# Patient Record
Sex: Male | Born: 1945 | Race: White | Hispanic: No | Marital: Married | Smoking: Former smoker
Health system: Southern US, Community
[De-identification: ages and names within clinical notes are randomized; demographics above are authoritative.]

## PROBLEM LIST (undated history)

## (undated) DIAGNOSIS — L4050 Arthropathic psoriasis, unspecified: Secondary | ICD-10-CM

## (undated) DIAGNOSIS — H409 Unspecified glaucoma: Secondary | ICD-10-CM

---

## 2005-09-10 ENCOUNTER — Ambulatory Visit: Payer: Self-pay | Admitting: Internal Medicine

## 2008-11-12 HISTORY — PX: HERNIA REPAIR: SHX51

## 2010-10-08 ENCOUNTER — Inpatient Hospital Stay (INDEPENDENT_AMBULATORY_CARE_PROVIDER_SITE_OTHER)
Admission: RE | Admit: 2010-10-08 | Discharge: 2010-10-08 | Disposition: A | Discharge status: Home / Self Care | Payer: Medicare PPO | Source: Ambulatory Visit | Attending: Emergency Medicine | Admitting: Emergency Medicine

## 2010-10-08 DIAGNOSIS — H6120 Impacted cerumen, unspecified ear: Secondary | ICD-10-CM

## 2011-07-15 ENCOUNTER — Telehealth (HOSPITAL_COMMUNITY): Payer: Self-pay | Admitting: *Deleted

## 2011-07-15 ENCOUNTER — Emergency Department (INDEPENDENT_AMBULATORY_CARE_PROVIDER_SITE_OTHER)
Admission: EM | Admit: 2011-07-15 | Discharge: 2011-07-15 | Disposition: A | Discharge status: Home / Self Care | Payer: Medicare PPO | Source: Home / Self Care | Attending: Emergency Medicine | Admitting: Emergency Medicine

## 2011-07-15 DIAGNOSIS — J069 Acute upper respiratory infection, unspecified: Secondary | ICD-10-CM

## 2011-07-15 HISTORY — DX: Arthropathic psoriasis, unspecified: L40.50

## 2011-07-15 MED ORDER — FLUTICASONE PROPIONATE 50 MCG/ACT NA SUSP: 2.0000 | Freq: Every day | NASAL | Status: AC

## 2011-07-15 MED ORDER — FEXOFENADINE-PSEUDOEPHED ER 60-120 MG PO TB12: 1.0000 | ORAL_TABLET | Freq: Two times a day (BID) | ORAL | Status: AC

## 2011-07-15 NOTE — ED Provider Notes (Signed)
History     CSN: [id number]  Arrival date & time 07/15/11  1043   First MD Initiated Contact with Patient 07/15/11 1324      Chief Complaint  Patient presents with  . Cough    3 day hx of cough, runny nose.  Today started having sore throat.  Has been taking mucinex, sudafed, and allegra.      (Consider location/radiation/quality/duration/timing/severity/associated sxs/prior treatment) HPI Comments: 4 days clear rhinorrhea, irritated throat, nonproductive cough worst in am. Axillary temp 100.3 last night.. No N/V, HA, wheeze, SOB, abd pain, urinary c/o diarrhea. Taking allegra, mucinex, sudafed PE w/o relief.   Patient is a 65 y.o. male presenting with cough.  Cough Associated symptoms include rhinorrhea and sore throat. Pertinent negatives include no chest pain, no ear pain, no headaches, no shortness of breath and no wheezing.    Past Medical History  Diagnosis Date  . Psoriatic arthritis   . Multiple myeloma     Past Surgical History  Procedure Date  . Hernia repair 11/2008    History reviewed. No pertinent family history.  History  Substance Use Topics  . Smoking status: Former Games developer  . Smokeless tobacco: Not on file  . Alcohol Use: No      Review of Systems  Constitutional: Positive for fever.  HENT: Positive for sore throat, rhinorrhea, sneezing and postnasal drip. Negative for ear pain, trouble swallowing and voice change.   Respiratory: Positive for cough. Negative for chest tightness, shortness of breath and wheezing.   Cardiovascular: Negative for chest pain.  Gastrointestinal: Negative for nausea, vomiting, abdominal pain and diarrhea.  Skin: Negative for rash.  Neurological: Negative for headaches.    Allergies  Review of patient's allergies indicates no known allergies.  Home Medications   Current Outpatient Rx  Name Route Sig Dispense Refill  . ALENDRONATE SODIUM 10 MG PO TABS Oral Take 10 mg by mouth daily before breakfast. Take with a  full glass of water on an empty stomach.     . FOLIC ACID 1 MG PO TABS Oral Take 1 mg by mouth daily.      Marland Kitchen METHOTREXATE (ANTI-RHEUMATIC) 2.5 MG PO TABS Oral Take 7.5 mg by mouth 3 (three) times a week.      . MULTI-VITAMIN/MINERALS PO TABS Oral Take 1 tablet by mouth daily.      Marland Kitchen RESVERATROL PO Oral Take 1 tablet by mouth.      Marland Kitchen FEXOFENADINE-PSEUDOEPHED ER 60-120 MG PO TB12 Oral Take 1 tablet by mouth every 12 (twelve) hours. 30 tablet 0  . FLUTICASONE PROPIONATE 50 MCG/ACT NA SUSP Nasal Place 2 sprays into the nose daily. 16 g 2    BP 147/77  Pulse 72  Temp(Src) 98.3 F (36.8 C) (Oral)  Resp 16  SpO2 98%  Physical Exam  Nursing note and vitals reviewed. Constitutional: He is oriented to person, place, and time. He appears well-developed and well-nourished.  HENT:  Head: Normocephalic and atraumatic.  Right Ear: Hearing, tympanic membrane and ear canal normal.  Left Ear: Hearing, tympanic membrane and ear canal normal.  Nose: Mucosal edema and rhinorrhea present. No epistaxis.  Mouth/Throat: Uvula is midline and mucous membranes are normal. Posterior oropharyngeal erythema present. No oropharyngeal exudate.       No sinus tenderness  Eyes: Conjunctivae and EOM are normal. Pupils are equal, round, and reactive to light.  Neck: Normal range of motion. Neck supple.  Cardiovascular: Normal rate, regular rhythm and normal heart sounds.   Pulmonary/Chest: Effort  normal and breath sounds normal. No respiratory distress.  Abdominal: Soft. Bowel sounds are normal. He exhibits no distension. There is no tenderness.  Musculoskeletal: Normal range of motion. He exhibits no edema and no tenderness.  Lymphadenopathy:    He has no cervical adenopathy.  Neurological: He is alert and oriented to person, place, and time.  Skin: Skin is warm and dry. No rash noted.  Psychiatric: He has a normal mood and affect. His behavior is normal. Judgment and thought content normal.    ED Course   Procedures (including critical care time)  Labs Reviewed - No data to display No results found.   1. URI (upper respiratory infection)       MDM    Luiz Blare, MD 07/15/11 [phone]

## 2011-07-15 NOTE — ED Notes (Signed)
3 day hx of cough, runny nose.  Today started having sore throat.  Has been taking mucinex, sudafed, and allegra.  Low grade fever last night.

## 2011-07-18 ENCOUNTER — Telehealth (HOSPITAL_COMMUNITY): Payer: Self-pay | Admitting: *Deleted

## 2016-11-06 ENCOUNTER — Emergency Department (HOSPITAL_COMMUNITY): Payer: Medicare HMO

## 2016-11-06 ENCOUNTER — Encounter (HOSPITAL_COMMUNITY): Payer: Self-pay

## 2016-11-06 ENCOUNTER — Emergency Department (HOSPITAL_COMMUNITY)
Admission: EM | Admit: 2016-11-06 | Discharge: 2016-11-06 | Disposition: A | Discharge status: Home / Self Care | Payer: Medicare HMO | Attending: Emergency Medicine | Admitting: Emergency Medicine

## 2016-11-06 DIAGNOSIS — R42 Dizziness and giddiness: Secondary | ICD-10-CM | POA: Diagnosis not present

## 2016-11-06 DIAGNOSIS — Z7982 Long term (current) use of aspirin: Secondary | ICD-10-CM | POA: Insufficient documentation

## 2016-11-06 DIAGNOSIS — Z87891 Personal history of nicotine dependence: Secondary | ICD-10-CM | POA: Insufficient documentation

## 2016-11-06 DIAGNOSIS — R509 Fever, unspecified: Secondary | ICD-10-CM | POA: Diagnosis not present

## 2016-11-06 DIAGNOSIS — R55 Syncope and collapse: Secondary | ICD-10-CM | POA: Insufficient documentation

## 2016-11-06 LAB — CBC
HCT: 36.2 % — ABNORMAL LOW (ref 39.0–52.0)
Hemoglobin: 12.2 g/dL — ABNORMAL LOW (ref 13.0–17.0)
MCH: 32.7 pg (ref 26.0–34.0)
MCHC: 33.7 g/dL (ref 30.0–36.0)
MCV: 97.1 fL (ref 78.0–100.0)
PLATELETS: 147 10*3/uL — AB (ref 150–400)
RBC: 3.73 MIL/uL — AB (ref 4.22–5.81)
RDW: 14.3 % (ref 11.5–15.5)
WBC: 10.7 10*3/uL — AB (ref 4.0–10.5)

## 2016-11-06 LAB — BASIC METABOLIC PANEL
Anion gap: 8 (ref 5–15)
BUN: 18 mg/dL (ref 6–20)
CALCIUM: 8.3 mg/dL — AB (ref 8.9–10.3)
CHLORIDE: 108 mmol/L (ref 101–111)
CO2: 22 mmol/L (ref 22–32)
CREATININE: 1.11 mg/dL (ref 0.61–1.24)
GFR calc Af Amer: >60 mL/min (ref >60)
GFR calc non Af Amer: >60 mL/min (ref >60)
GLUCOSE: 112 mg/dL — AB (ref 65–99)
Potassium: 3.6 mmol/L (ref 3.5–5.1)
Sodium: 138 mmol/L (ref 135–145)

## 2016-11-06 LAB — URINALYSIS, ROUTINE W REFLEX MICROSCOPIC
Bilirubin Urine: NEGATIVE
GLUCOSE, UA: NEGATIVE mg/dL
Hgb urine dipstick: NEGATIVE
Ketones, ur: NEGATIVE mg/dL
LEUKOCYTES UA: NEGATIVE
Nitrite: NEGATIVE
PROTEIN: NEGATIVE mg/dL
Specific Gravity, Urine: 1.018 (ref 1.005–1.030)
pH: 5 (ref 5.0–8.0)

## 2016-11-06 LAB — CBG MONITORING, ED: Glucose-Capillary: 115 mg/dL — ABNORMAL HIGH (ref 65–99)

## 2016-11-06 MED ORDER — SODIUM CHLORIDE 0.9 % IV BOLUS (SEPSIS)
1000.0000 mL | Freq: Once | INTRAVENOUS | Status: AC
Start: 2016-11-06 — End: 2016-11-06
  Administered 2016-11-06: 1000 mL via INTRAVENOUS

## 2016-11-06 MED ORDER — SODIUM CHLORIDE 0.9 % IV BOLUS (SEPSIS)
1000.0000 mL | Freq: Once | INTRAVENOUS | Status: AC
  Administered 2016-11-06: 1000 mL via INTRAVENOUS

## 2016-11-06 NOTE — ED Notes (Addendum)
Pt ambulated 50 ft.  Pt showed no signs of distress.  Pt maintained a steady gait.  Pt stated that they felt well .  All vital signs within normal limits.

## 2016-11-06 NOTE — ED Notes (Signed)
ED Provider at bedside. 

## 2016-11-06 NOTE — ED Notes (Signed)
Per EMS- Pt woke up to use bathroom, became dizzy, almost passed out. Incontinent of stool during incident. Recorded temp of 102.  Pt has had intermittent cough for about a month. Pt took 975 of tylenol PTA.

## 2016-11-06 NOTE — ED Notes (Signed)
Bed: WU98 Expected date:  Expected time:  Means of arrival:  Comments: Syncopal episode/incontinent

## 2016-11-06 NOTE — ED Provider Notes (Signed)
[facility] DEPT Provider Note   CSN: [id number] Arrival date & time: 11/06/16  0325     History   Chief Complaint Chief Complaint  Patient presents with  . Near Syncope    HPI Jonathan Neal is a 71 y.o. male.  The history is provided by the patient and the spouse.  Near Syncope  This is a new problem. Episode onset: just prior to arrival. The problem has been gradually improving. Pertinent negatives include no chest pain, no abdominal pain and no shortness of breath. The symptoms are aggravated by walking. The symptoms are relieved by rest.  patient presents with near syncope He reports he woke up to go to bathroom and felt dizzy/lightheaded and nearly passed out He was incontinent of urine during this time ( denied stool incontinence) No seizures No cp He has had recent cough/congestion Wife reports fever up to 102 at home and then took APAP  He has no pain complaints at this time   He denies h/o CAD/CVA No new medications Past Medical History:  Diagnosis Date  . Multiple myeloma   . Psoriatic arthritis ([place])     There are no active problems to display for this patient.   Past Surgical History:  Procedure Laterality Date  . HERNIA REPAIR  11/2008       Home Medications    Prior to Admission medications   Medication Sig Start Date End Date Taking? Authorizing Provider  aspirin EC 81 MG tablet Take 81 mg by mouth daily.   Yes Historical Provider, MD  calcium carbonate (OS-CAL - DOSED IN MG OF ELEMENTAL CALCIUM) 1250 (500 Ca) MG tablet Take 1 tablet by mouth.   Yes Historical Provider, MD  fluticasone (FLONASE) 50 MCG/ACT nasal spray Place 2 sprays into the nose daily. 07/15/11 11/06/16 Yes Melynda Ripple, MD  folic acid (FOLVITE) 1 MG tablet Take 1 mg by mouth daily.     Yes Historical Provider, MD  methotrexate (RHEUMATREX) 2.5 MG tablet Take 15 mg by mouth every 7 (seven) days.    Yes Historical Provider, MD  Multiple Vitamins-Minerals  (MULTIVITAMIN WITH MINERALS) tablet Take 1 tablet by mouth daily.     Yes Historical Provider, MD  Omega-3 Fatty Acids (FISH OIL PO) Take 1 capsule by mouth daily.   Yes Historical Provider, MD    Family History No family history on file.  Social History Social History  Substance Use Topics  . Smoking status: Former Research scientist (life sciences)  . Smokeless tobacco: Not on file  . Alcohol use No     Allergies   Patient has no known allergies.   Review of Systems Review of Systems  Constitutional: Positive for diaphoresis and fever.  Respiratory: Negative for shortness of breath.   Cardiovascular: Positive for near-syncope. Negative for chest pain.  Gastrointestinal: Negative for abdominal pain.  Neurological: Positive for light-headedness. Negative for seizures.  All other systems reviewed and are negative.    Physical Exam Updated Vital Signs BP 118/64 (BP Location: Left Arm)   Pulse 83   Temp 97.8 F (36.6 C) (Oral)   Resp 15   SpO2 94%   Physical Exam CONSTITUTIONAL: Well developed/well nourished HEAD: Normocephalic/atraumatic EYES: EOMI/PERRL ENMT: Mucous membranes moist NECK: supple no meningeal signs SPINE/BACK:entire spine nontender CV: S1/S2 noted, no murmurs/rubs/gallops noted LUNGS: crackles in left base, no distress noted ABDOMEN: soft, nontender, no rebound or guarding, bowel sounds noted throughout abdomen GU:no cva tenderness NEURO: Pt is awake/alert/appropriate, moves all extremitiesx4.  No facial droop.  EXTREMITIES: pulses normal/equal, full ROM SKIN: warm, color normal PSYCH: no abnormalities of mood noted, alert and oriented to situation   ED Treatments / Results  Labs (all labs ordered are listed, but only abnormal results are displayed) Labs Reviewed  BASIC METABOLIC PANEL - Abnormal; Notable for the following:       Result Value   Glucose, Bld 112 (*)    Calcium 8.3 (*)    All other components within normal limits  CBC - Abnormal; Notable for the  following:    WBC 10.7 (*)    RBC 3.73 (*)    Hemoglobin 12.2 (*)    HCT 36.2 (*)    Platelets 147 (*)    All other components within normal limits  CBG MONITORING, ED - Abnormal; Notable for the following:    Glucose-Capillary 115 (*)    All other components within normal limits  URINALYSIS, ROUTINE W REFLEX MICROSCOPIC    EKG  EKG Interpretation  Date/Time:  Wednesday November 06 2016 03:35:53 EDT Ventricular Rate:  85 PR Interval:    QRS Duration: 93 QT Interval:  348 QTC Calculation: 414 R Axis:   47 Text Interpretation:  Sinus rhythm No previous ECGs available Confirmed by Christy Gentles  MD, Leniya Breit ([id number]) on 11/06/2016 3:55:59 AM       Radiology Dg Chest 2 View  Result Date: 11/06/2016 CLINICAL DATA:  Syncope and incontinence EXAM: CHEST  2 VIEW COMPARISON:  None. FINDINGS: There is mild cardiomegaly. There is bibasilar atelectasis but no focal consolidation. No pulmonary edema. No pleural effusion or pneumothorax. IMPRESSION: No active cardiopulmonary disease. Electronically Signed   By: Ulyses Jarred M.D.   On: 11/06/2016 05:52    Procedures Procedures (including critical care time)  Medications Ordered in ED Medications  sodium chloride 0.9 % bolus 1,000 mL (0 mLs Intravenous Stopped 11/06/16 0656)  sodium chloride 0.9 % bolus 1,000 mL (1,000 mLs Intravenous New Bag/Given 11/06/16 0655)     Initial Impression / Assessment and Plan / ED Course  I have reviewed the triage vital signs and the nursing notes.  Pertinent labs & imaging results that were available during my care of the patient were reviewed by me and considered in my medical decision making (see chart for details).     Pt presents with near syncope episode after standing abruptly from sleeping He did not have full LOC He denied CP/SOB He is now at baseline No focal weakness reported He responded to IV fluids He ambulated without difficulty No hypoxia on RA I offered admission/monitoring, but patient  declined He would like to go home We discussed strict ER return precautions   Final Clinical Impressions(s) / ED Diagnoses   Final diagnoses:  Near syncope    New Prescriptions New Prescriptions   No medications on file     Ripley Fraise, MD 11/06/16 [phone]

## 2017-04-28 ENCOUNTER — Emergency Department (HOSPITAL_COMMUNITY): Payer: Non-veteran care

## 2017-04-28 ENCOUNTER — Encounter (HOSPITAL_COMMUNITY): Payer: Self-pay | Admitting: Emergency Medicine

## 2017-04-28 ENCOUNTER — Emergency Department (HOSPITAL_COMMUNITY)
Admission: EM | Admit: 2017-04-28 | Discharge: 2017-04-28 | Disposition: A | Discharge status: Home / Self Care | Payer: Non-veteran care | Attending: Emergency Medicine | Admitting: Emergency Medicine

## 2017-04-28 DIAGNOSIS — W230XXA Caught, crushed, jammed, or pinched between moving objects, initial encounter: Secondary | ICD-10-CM | POA: Diagnosis not present

## 2017-04-28 DIAGNOSIS — Y999 Unspecified external cause status: Secondary | ICD-10-CM | POA: Insufficient documentation

## 2017-04-28 DIAGNOSIS — Y929 Unspecified place or not applicable: Secondary | ICD-10-CM | POA: Diagnosis not present

## 2017-04-28 DIAGNOSIS — Z79899 Other long term (current) drug therapy: Secondary | ICD-10-CM | POA: Diagnosis not present

## 2017-04-28 DIAGNOSIS — M25512 Pain in left shoulder: Secondary | ICD-10-CM | POA: Diagnosis not present

## 2017-04-28 DIAGNOSIS — Y939 Activity, unspecified: Secondary | ICD-10-CM | POA: Diagnosis not present

## 2017-04-28 DIAGNOSIS — S99911A Unspecified injury of right ankle, initial encounter: Secondary | ICD-10-CM | POA: Diagnosis not present

## 2017-04-28 DIAGNOSIS — S82831A Other fracture of upper and lower end of right fibula, initial encounter for closed fracture: Secondary | ICD-10-CM | POA: Insufficient documentation

## 2017-04-28 DIAGNOSIS — Z87891 Personal history of nicotine dependence: Secondary | ICD-10-CM | POA: Insufficient documentation

## 2017-04-28 HISTORY — DX: Unspecified glaucoma: H40.9

## 2017-04-28 MED ORDER — IBUPROFEN 600 MG PO TABS: 600.0000 mg | ORAL_TABLET | Freq: Four times a day (QID) | ORAL | 0 refills | Status: AC | PRN

## 2017-04-28 MED ORDER — BACITRACIN ZINC 500 UNIT/GM EX OINT
TOPICAL_OINTMENT | CUTANEOUS | Status: AC
  Administered 2017-04-28: 18:00:00
  Filled 2017-04-28: qty 0.9

## 2017-04-28 MED ORDER — ACETAMINOPHEN 325 MG PO TABS
650.0000 mg | ORAL_TABLET | Freq: Once | ORAL | Status: AC
  Administered 2017-04-28: 650 mg via ORAL
  Filled 2017-04-28: qty 2

## 2017-04-28 MED ORDER — IBUPROFEN 200 MG PO TABS
400.0000 mg | ORAL_TABLET | Freq: Once | ORAL | Status: AC
  Administered 2017-04-28: 400 mg via ORAL
  Filled 2017-04-28: qty 2

## 2017-04-28 NOTE — Discharge Instructions (Signed)
Please read and follow all provided instructions.  Your diagnoses today include:  1. Other closed fracture of distal end of right fibula, initial encounter   2. Acute pain of left shoulder     Tests performed today include: Vital signs. See below for your results today.   Medications prescribed:  Take as prescribed   Home care instructions:  Follow any educational materials contained in this packet.  Follow-up instructions: Please follow-up with your Orthopedics for further evaluation of symptoms and treatment   Return instructions:  Please return to the Emergency Department if you do not get better, if you get worse, or new symptoms OR  - Fever (temperature greater than 101.17F)  - Bleeding that does not stop with holding pressure to the area    -Severe pain (please note that you may be more sore the day after your accident)  - Chest Pain  - Difficulty breathing  - Severe nausea or vomiting  - Inability to tolerate food and liquids  - Passing out  - Skin becoming red around your wounds  - Change in mental status (confusion or lethargy)  - New numbness or weakness    Please return if you have any other emergent concerns.  Additional Information:  Your vital signs today were: BP (!) 153/100 (BP Location: Right Arm)    Pulse 65    Temp 97.8 F (36.6 C) (Oral)    Resp 18    SpO2 99%  If your blood pressure (BP) was elevated above 135/85 this visit, please have this repeated by your doctor within one month. ---------------

## 2017-04-28 NOTE — ED Triage Notes (Signed)
Pt verbalizes "printing machine" falling on him resulting in left shoulder and right ankle pain. Abrasion noted to right ear and right hand. Pt FOM of right hand. Pt denies LOC.

## 2017-04-28 NOTE — ED Provider Notes (Signed)
[facility] DEPT Provider Note   CSN: [id number] Arrival date & time: 04/28/17  1231     History   Chief Complaint Chief Complaint  Patient presents with  . Ankle Injury  . Shoulder Injury    HPI Jonathan Neal is a 71 y.o. male.  HPI  71 y.o. male with a hx of Multiple Myeloma, presents to the Emergency Department today due to left shoulder and right ankle pain. Pt states a printing press slipped form his grasp from a fork lift and "torqued" his left shoulder, but landed on his right ankle. Notes pain with ROM. Rates 6/10. Throbbing. Mild abrasion on lateral aspect. Tetanus UTD. Denies head trauma or LOC. No numbness/tingling. No fevers. No meds PTA. No other symptoms noted.   Past Medical History:  Diagnosis Date  . Glaucoma   . Multiple myeloma   . Psoriatic arthritis ([place])     There are no active problems to display for this patient.   Past Surgical History:  Procedure Laterality Date  . HERNIA REPAIR  11/2008       Home Medications    Prior to Admission medications   Medication Sig Start Date End Date Taking? Authorizing Provider  aspirin EC 81 MG tablet Take 81 mg by mouth daily.    [provider]  calcium carbonate (OS-CAL - DOSED IN MG OF ELEMENTAL CALCIUM) 1250 (500 Ca) MG tablet Take 1 tablet by mouth.    [provider]  fluticasone (FLONASE) 50 MCG/ACT nasal spray Place 2 sprays into the nose daily. 07/15/11 11/06/16  Melynda Ripple, MD  folic acid (FOLVITE) 1 MG tablet Take 1 mg by mouth daily.      [provider]  methotrexate (RHEUMATREX) 2.5 MG tablet Take 15 mg by mouth every 7 (seven) days.     [provider]  Multiple Vitamins-Minerals (MULTIVITAMIN WITH MINERALS) tablet Take 1 tablet by mouth daily.      [provider]  Omega-3 Fatty Acids (FISH OIL PO) Take 1 capsule by mouth daily.    [provider]    Family History No family history on  file.  Social History Social History  Substance Use Topics  . Smoking status: Former Research scientist (life sciences)  . Smokeless tobacco: Not on file  . Alcohol use No     Allergies   Patient has no known allergies.   Review of Systems Review of Systems ROS reviewed and all are negative for acute change except as noted in the HPI.  Physical Exam Updated Vital Signs BP (!) 137/93 (BP Location: Right Arm)   Pulse (!) 52   Temp 97.6 F (36.4 C) (Oral)   Resp 16   SpO2 96%   Physical Exam  Constitutional: He is oriented to person, place, and time. Vital signs are normal. He appears well-developed and well-nourished.  HENT:  Head: Normocephalic and atraumatic.  Right Ear: Hearing normal.  Left Ear: Hearing normal.  Eyes: Pupils are equal, round, and reactive to light. Conjunctivae and EOM are normal.  Neck: Normal range of motion. Neck supple.  Cardiovascular: Normal rate, regular rhythm, normal heart sounds and intact distal pulses.   Pulmonary/Chest: Effort normal and breath sounds normal.  Musculoskeletal: Normal range of motion.  Right ankle with swelling noted. Compartments soft. NVI. Distal pulses appreciated. TTP distal fib and medial mal.  Right shoulder with mild TTP distal clavicle. No swelling. ROM intact, but noted difficulty with abduction.   Neurological: He is alert and oriented to  person, place, and time.  Skin: Skin is warm and dry.  Psychiatric: He has a normal mood and affect. His speech is normal and behavior is normal. Thought content normal.  Nursing note and vitals reviewed.    ED Treatments / Results  Labs (all labs ordered are listed, but only abnormal results are displayed) Labs Reviewed - No data to display  EKG  EKG Interpretation None       Radiology Dg Ankle Complete Right  Result Date: 04/28/2017 CLINICAL DATA:  Right ankle injury.  Initial encounter. EXAM: RIGHT ANKLE - COMPLETE 3+ VIEW COMPARISON:  None. FINDINGS: Oblique fracture of the distal  fibula at and above the ankle joint, nondisplaced. Widened but nondisplaced transverse fracture of the medial malleolus at the level of the ankle joint. No posterior malleolus or hindfoot fracture is noted. No dislocation. IMPRESSION: Distal fibular and medial malleolus fractures as described. Electronically Signed   By: Monte Fantasia M.D.   On: 04/28/2017 13:47   Dg Shoulder Left  Result Date: 04/28/2017 CLINICAL DATA:  Left shoulder pain after printing machine fell on him. EXAM: LEFT SHOULDER - 2+ VIEW COMPARISON:  Chest x-ray dated November 06, 2016. FINDINGS: No definite acute fracture or dislocation. 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 The acromioclavicular and glenohumeral joint spaces are preserved. Soft tissues are unremarkable. IMPRESSION: No definite acute fracture. 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 Correlate with point tenderness. Electronically Signed   By: Titus Dubin M.D.   On: 04/28/2017 13:50    Procedures Procedures (including critical care time)  Medications Ordered in ED Medications - No data to display   Initial Impression / Assessment and Plan / ED Course  I have reviewed the triage vital signs and the nursing notes.  Pertinent labs & imaging results that were available during my care of the patient were reviewed by me and considered in my medical decision making (see chart for details).  Final Clinical Impressions(s) / ED Diagnoses   {I have reviewed and evaluated the relevant imaging studies.  {I have reviewed the relevant previous healthcare records.  {I obtained HPI from historian.   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 Right ankle with distal fibular and medial malleolus fractures. Placed in posterior with stirrup splint. Crutches. Pt advised to follow up with orthopedics. Conservative therapy recommended  and discussed. I have reviewed the [place] Controlled Substance Reporting System. . Given Rx Percocet. Patient will be discharged home & is agreeable with above plan. Returns precautions discussed. Pt appears safe for discharge.  Disposition/Plan:  DC Home Additional Verbal discharge instructions given and discussed with patient.  Pt Instructed to f/u with Ortho in the next week for evaluation and treatment of symptoms. Return precautions given Pt acknowledges and agrees with plan  Supervising Physician Milton Ferguson, MD  Final diagnoses:  Other closed fracture of distal end of right fibula, initial encounter  Acute pain of left shoulder    New Prescriptions New Prescriptions   No medications on file     Shary Decamp, Hershal Coria 04/28/17 [place], Joseph, MD 04/28/17 2309

## 2017-04-28 NOTE — ED Notes (Signed)
Patient to be discharged with family at this time via wheelchair. All questions answered regarding medication and follow up care.

## 2017-04-29 DIAGNOSIS — M25571 Pain in right ankle and joints of right foot: Secondary | ICD-10-CM | POA: Diagnosis not present

## 2017-05-03 DIAGNOSIS — M25571 Pain in right ankle and joints of right foot: Secondary | ICD-10-CM | POA: Diagnosis not present

## 2017-05-05 DIAGNOSIS — M25571 Pain in right ankle and joints of right foot: Secondary | ICD-10-CM | POA: Diagnosis not present

## 2017-05-08 DIAGNOSIS — M25571 Pain in right ankle and joints of right foot: Secondary | ICD-10-CM | POA: Diagnosis not present

## 2017-05-12 DIAGNOSIS — M25571 Pain in right ankle and joints of right foot: Secondary | ICD-10-CM | POA: Diagnosis not present

## 2017-05-14 DIAGNOSIS — G8918 Other acute postprocedural pain: Secondary | ICD-10-CM | POA: Diagnosis not present

## 2017-05-14 DIAGNOSIS — S8251XA Displaced fracture of medial malleolus of right tibia, initial encounter for closed fracture: Secondary | ICD-10-CM | POA: Diagnosis not present

## 2017-05-14 DIAGNOSIS — S82841A Displaced bimalleolar fracture of right lower leg, initial encounter for closed fracture: Secondary | ICD-10-CM | POA: Diagnosis not present

## 2017-05-26 DIAGNOSIS — M25571 Pain in right ankle and joints of right foot: Secondary | ICD-10-CM | POA: Diagnosis not present

## 2017-06-16 DIAGNOSIS — S82841D Displaced bimalleolar fracture of right lower leg, subsequent encounter for closed fracture with routine healing: Secondary | ICD-10-CM | POA: Diagnosis not present

## 2017-06-16 DIAGNOSIS — M25512 Pain in left shoulder: Secondary | ICD-10-CM | POA: Diagnosis not present

## 2017-06-30 DIAGNOSIS — M6281 Muscle weakness (generalized): Secondary | ICD-10-CM | POA: Diagnosis not present

## 2017-06-30 DIAGNOSIS — S82841S Displaced bimalleolar fracture of right lower leg, sequela: Secondary | ICD-10-CM | POA: Diagnosis not present

## 2017-06-30 DIAGNOSIS — R2689 Other abnormalities of gait and mobility: Secondary | ICD-10-CM | POA: Diagnosis not present

## 2017-06-30 DIAGNOSIS — M25671 Stiffness of right ankle, not elsewhere classified: Secondary | ICD-10-CM | POA: Diagnosis not present

## 2017-07-04 DIAGNOSIS — M25671 Stiffness of right ankle, not elsewhere classified: Secondary | ICD-10-CM | POA: Diagnosis not present

## 2017-07-04 DIAGNOSIS — M6281 Muscle weakness (generalized): Secondary | ICD-10-CM | POA: Diagnosis not present

## 2017-07-04 DIAGNOSIS — R2689 Other abnormalities of gait and mobility: Secondary | ICD-10-CM | POA: Diagnosis not present

## 2017-07-04 DIAGNOSIS — S82841S Displaced bimalleolar fracture of right lower leg, sequela: Secondary | ICD-10-CM | POA: Diagnosis not present

## 2017-07-09 DIAGNOSIS — S82841S Displaced bimalleolar fracture of right lower leg, sequela: Secondary | ICD-10-CM | POA: Diagnosis not present

## 2017-07-09 DIAGNOSIS — R2689 Other abnormalities of gait and mobility: Secondary | ICD-10-CM | POA: Diagnosis not present

## 2017-07-09 DIAGNOSIS — M25671 Stiffness of right ankle, not elsewhere classified: Secondary | ICD-10-CM | POA: Diagnosis not present

## 2017-07-09 DIAGNOSIS — M6281 Muscle weakness (generalized): Secondary | ICD-10-CM | POA: Diagnosis not present

## 2017-07-11 DIAGNOSIS — S82841S Displaced bimalleolar fracture of right lower leg, sequela: Secondary | ICD-10-CM | POA: Diagnosis not present

## 2017-07-11 DIAGNOSIS — M25671 Stiffness of right ankle, not elsewhere classified: Secondary | ICD-10-CM | POA: Diagnosis not present

## 2017-07-11 DIAGNOSIS — R2689 Other abnormalities of gait and mobility: Secondary | ICD-10-CM | POA: Diagnosis not present

## 2017-07-11 DIAGNOSIS — M6281 Muscle weakness (generalized): Secondary | ICD-10-CM | POA: Diagnosis not present

## 2017-07-17 DIAGNOSIS — M25512 Pain in left shoulder: Secondary | ICD-10-CM | POA: Diagnosis not present

## 2017-07-17 DIAGNOSIS — S82841D Displaced bimalleolar fracture of right lower leg, subsequent encounter for closed fracture with routine healing: Secondary | ICD-10-CM | POA: Diagnosis not present

## 2017-07-18 DIAGNOSIS — R2689 Other abnormalities of gait and mobility: Secondary | ICD-10-CM | POA: Diagnosis not present

## 2017-07-18 DIAGNOSIS — M6281 Muscle weakness (generalized): Secondary | ICD-10-CM | POA: Diagnosis not present

## 2017-07-18 DIAGNOSIS — S82841S Displaced bimalleolar fracture of right lower leg, sequela: Secondary | ICD-10-CM | POA: Diagnosis not present

## 2017-07-18 DIAGNOSIS — M25671 Stiffness of right ankle, not elsewhere classified: Secondary | ICD-10-CM | POA: Diagnosis not present

## 2017-07-21 DIAGNOSIS — M6281 Muscle weakness (generalized): Secondary | ICD-10-CM | POA: Diagnosis not present

## 2017-07-21 DIAGNOSIS — M25671 Stiffness of right ankle, not elsewhere classified: Secondary | ICD-10-CM | POA: Diagnosis not present

## 2017-07-21 DIAGNOSIS — S82841S Displaced bimalleolar fracture of right lower leg, sequela: Secondary | ICD-10-CM | POA: Diagnosis not present

## 2017-07-21 DIAGNOSIS — R2689 Other abnormalities of gait and mobility: Secondary | ICD-10-CM | POA: Diagnosis not present

## 2017-07-25 DIAGNOSIS — M6281 Muscle weakness (generalized): Secondary | ICD-10-CM | POA: Diagnosis not present

## 2017-07-25 DIAGNOSIS — R2689 Other abnormalities of gait and mobility: Secondary | ICD-10-CM | POA: Diagnosis not present

## 2017-07-25 DIAGNOSIS — M25671 Stiffness of right ankle, not elsewhere classified: Secondary | ICD-10-CM | POA: Diagnosis not present

## 2017-07-25 DIAGNOSIS — S82841S Displaced bimalleolar fracture of right lower leg, sequela: Secondary | ICD-10-CM | POA: Diagnosis not present

## 2017-07-30 DIAGNOSIS — S82841S Displaced bimalleolar fracture of right lower leg, sequela: Secondary | ICD-10-CM | POA: Diagnosis not present

## 2017-07-30 DIAGNOSIS — R2689 Other abnormalities of gait and mobility: Secondary | ICD-10-CM | POA: Diagnosis not present

## 2017-07-30 DIAGNOSIS — M25671 Stiffness of right ankle, not elsewhere classified: Secondary | ICD-10-CM | POA: Diagnosis not present

## 2017-07-30 DIAGNOSIS — M6281 Muscle weakness (generalized): Secondary | ICD-10-CM | POA: Diagnosis not present

## 2017-08-01 DIAGNOSIS — M6281 Muscle weakness (generalized): Secondary | ICD-10-CM | POA: Diagnosis not present

## 2017-08-01 DIAGNOSIS — R2689 Other abnormalities of gait and mobility: Secondary | ICD-10-CM | POA: Diagnosis not present

## 2017-08-01 DIAGNOSIS — S82841S Displaced bimalleolar fracture of right lower leg, sequela: Secondary | ICD-10-CM | POA: Diagnosis not present

## 2017-08-01 DIAGNOSIS — M25671 Stiffness of right ankle, not elsewhere classified: Secondary | ICD-10-CM | POA: Diagnosis not present

## 2017-08-06 DIAGNOSIS — M6281 Muscle weakness (generalized): Secondary | ICD-10-CM | POA: Diagnosis not present

## 2017-08-06 DIAGNOSIS — S82841S Displaced bimalleolar fracture of right lower leg, sequela: Secondary | ICD-10-CM | POA: Diagnosis not present

## 2017-08-06 DIAGNOSIS — R2689 Other abnormalities of gait and mobility: Secondary | ICD-10-CM | POA: Diagnosis not present

## 2017-08-06 DIAGNOSIS — M25671 Stiffness of right ankle, not elsewhere classified: Secondary | ICD-10-CM | POA: Diagnosis not present

## 2017-08-08 DIAGNOSIS — M25671 Stiffness of right ankle, not elsewhere classified: Secondary | ICD-10-CM | POA: Diagnosis not present

## 2017-08-08 DIAGNOSIS — M6281 Muscle weakness (generalized): Secondary | ICD-10-CM | POA: Diagnosis not present

## 2017-08-08 DIAGNOSIS — S82841S Displaced bimalleolar fracture of right lower leg, sequela: Secondary | ICD-10-CM | POA: Diagnosis not present

## 2017-08-08 DIAGNOSIS — R2689 Other abnormalities of gait and mobility: Secondary | ICD-10-CM | POA: Diagnosis not present

## 2017-08-11 DIAGNOSIS — R2689 Other abnormalities of gait and mobility: Secondary | ICD-10-CM | POA: Diagnosis not present

## 2017-08-11 DIAGNOSIS — M25671 Stiffness of right ankle, not elsewhere classified: Secondary | ICD-10-CM | POA: Diagnosis not present

## 2017-08-11 DIAGNOSIS — S82841S Displaced bimalleolar fracture of right lower leg, sequela: Secondary | ICD-10-CM | POA: Diagnosis not present

## 2017-08-11 DIAGNOSIS — M6281 Muscle weakness (generalized): Secondary | ICD-10-CM | POA: Diagnosis not present

## 2017-08-15 DIAGNOSIS — R2689 Other abnormalities of gait and mobility: Secondary | ICD-10-CM | POA: Diagnosis not present

## 2017-08-15 DIAGNOSIS — S82841S Displaced bimalleolar fracture of right lower leg, sequela: Secondary | ICD-10-CM | POA: Diagnosis not present

## 2017-08-15 DIAGNOSIS — M6281 Muscle weakness (generalized): Secondary | ICD-10-CM | POA: Diagnosis not present

## 2017-08-15 DIAGNOSIS — M25671 Stiffness of right ankle, not elsewhere classified: Secondary | ICD-10-CM | POA: Diagnosis not present

## 2017-08-18 DIAGNOSIS — M25512 Pain in left shoulder: Secondary | ICD-10-CM | POA: Diagnosis not present

## 2017-08-18 DIAGNOSIS — M25671 Stiffness of right ankle, not elsewhere classified: Secondary | ICD-10-CM | POA: Diagnosis not present

## 2017-08-20 DIAGNOSIS — M25671 Stiffness of right ankle, not elsewhere classified: Secondary | ICD-10-CM | POA: Diagnosis not present

## 2017-08-20 DIAGNOSIS — S82841S Displaced bimalleolar fracture of right lower leg, sequela: Secondary | ICD-10-CM | POA: Diagnosis not present

## 2017-08-20 DIAGNOSIS — M6281 Muscle weakness (generalized): Secondary | ICD-10-CM | POA: Diagnosis not present

## 2017-08-20 DIAGNOSIS — R2689 Other abnormalities of gait and mobility: Secondary | ICD-10-CM | POA: Diagnosis not present

## 2018-04-21 IMAGING — CR DG CHEST 2V
2 series · 2 of 2 positions shown · non-contrast
Comparison: None.

CLINICAL DATA: Syncope and incontinence

EXAM:
CHEST  2 VIEW

[x chest ap]
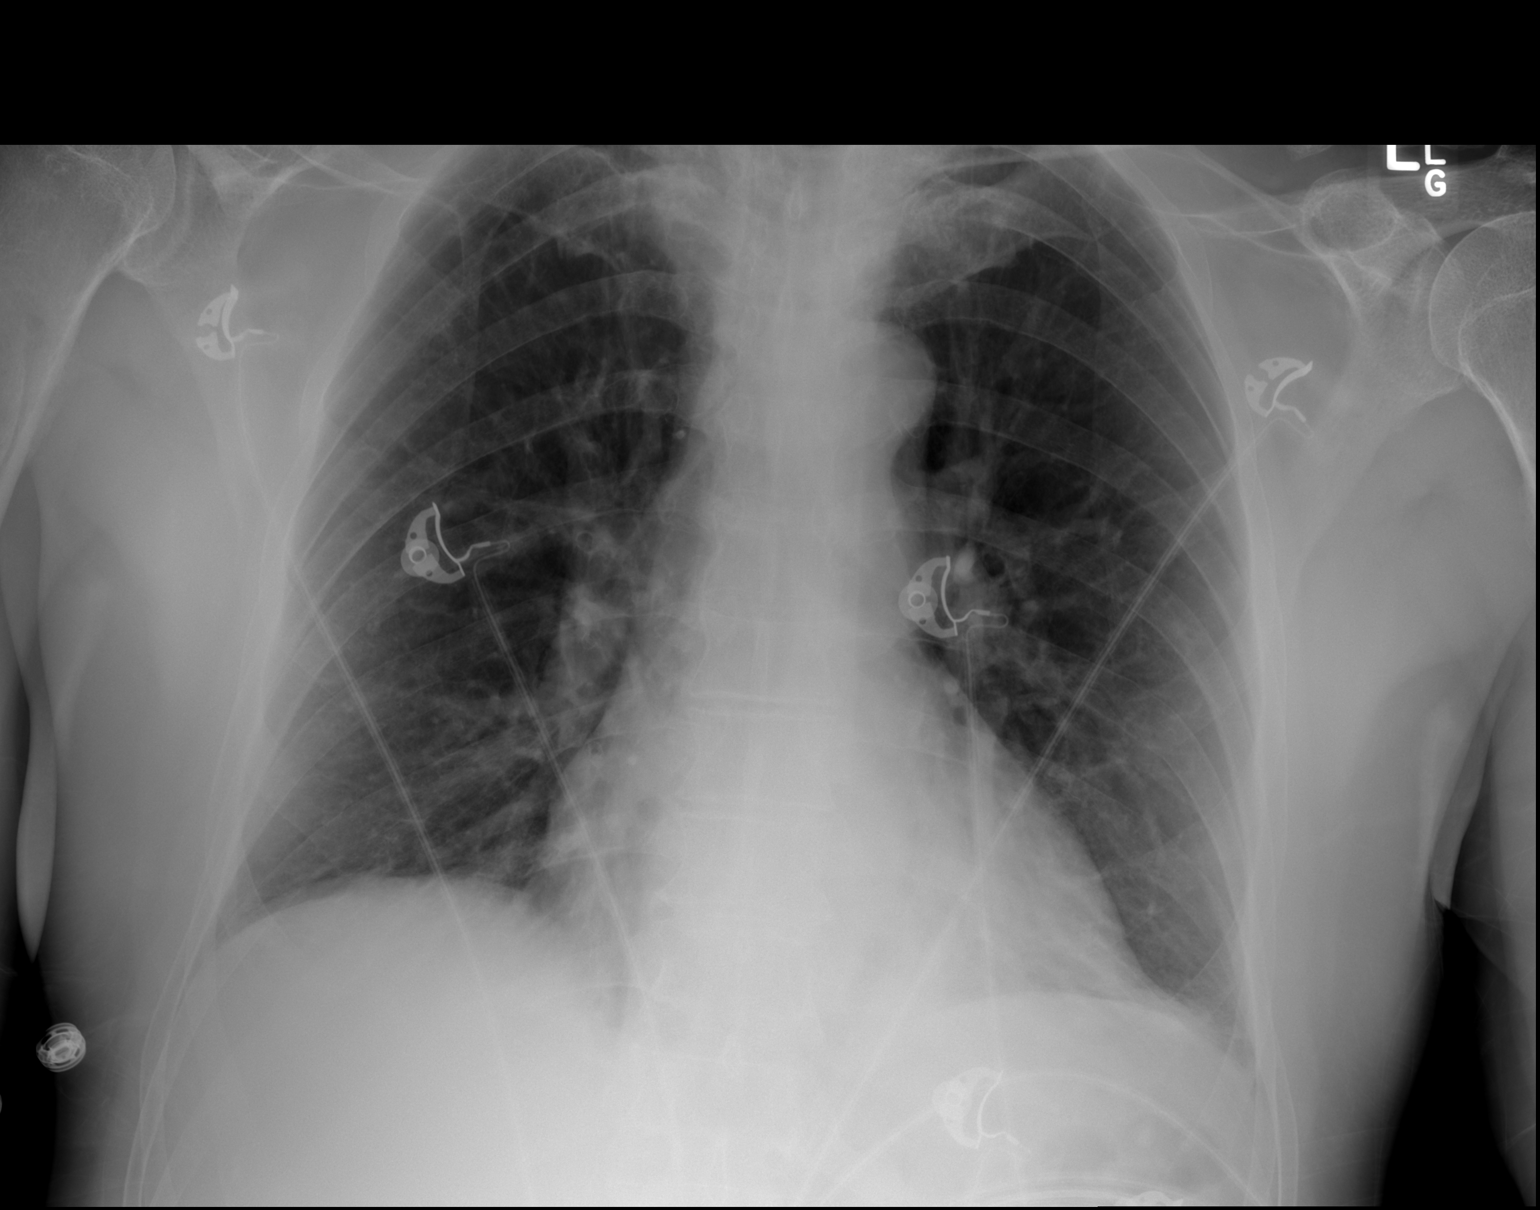

[w chest lat]
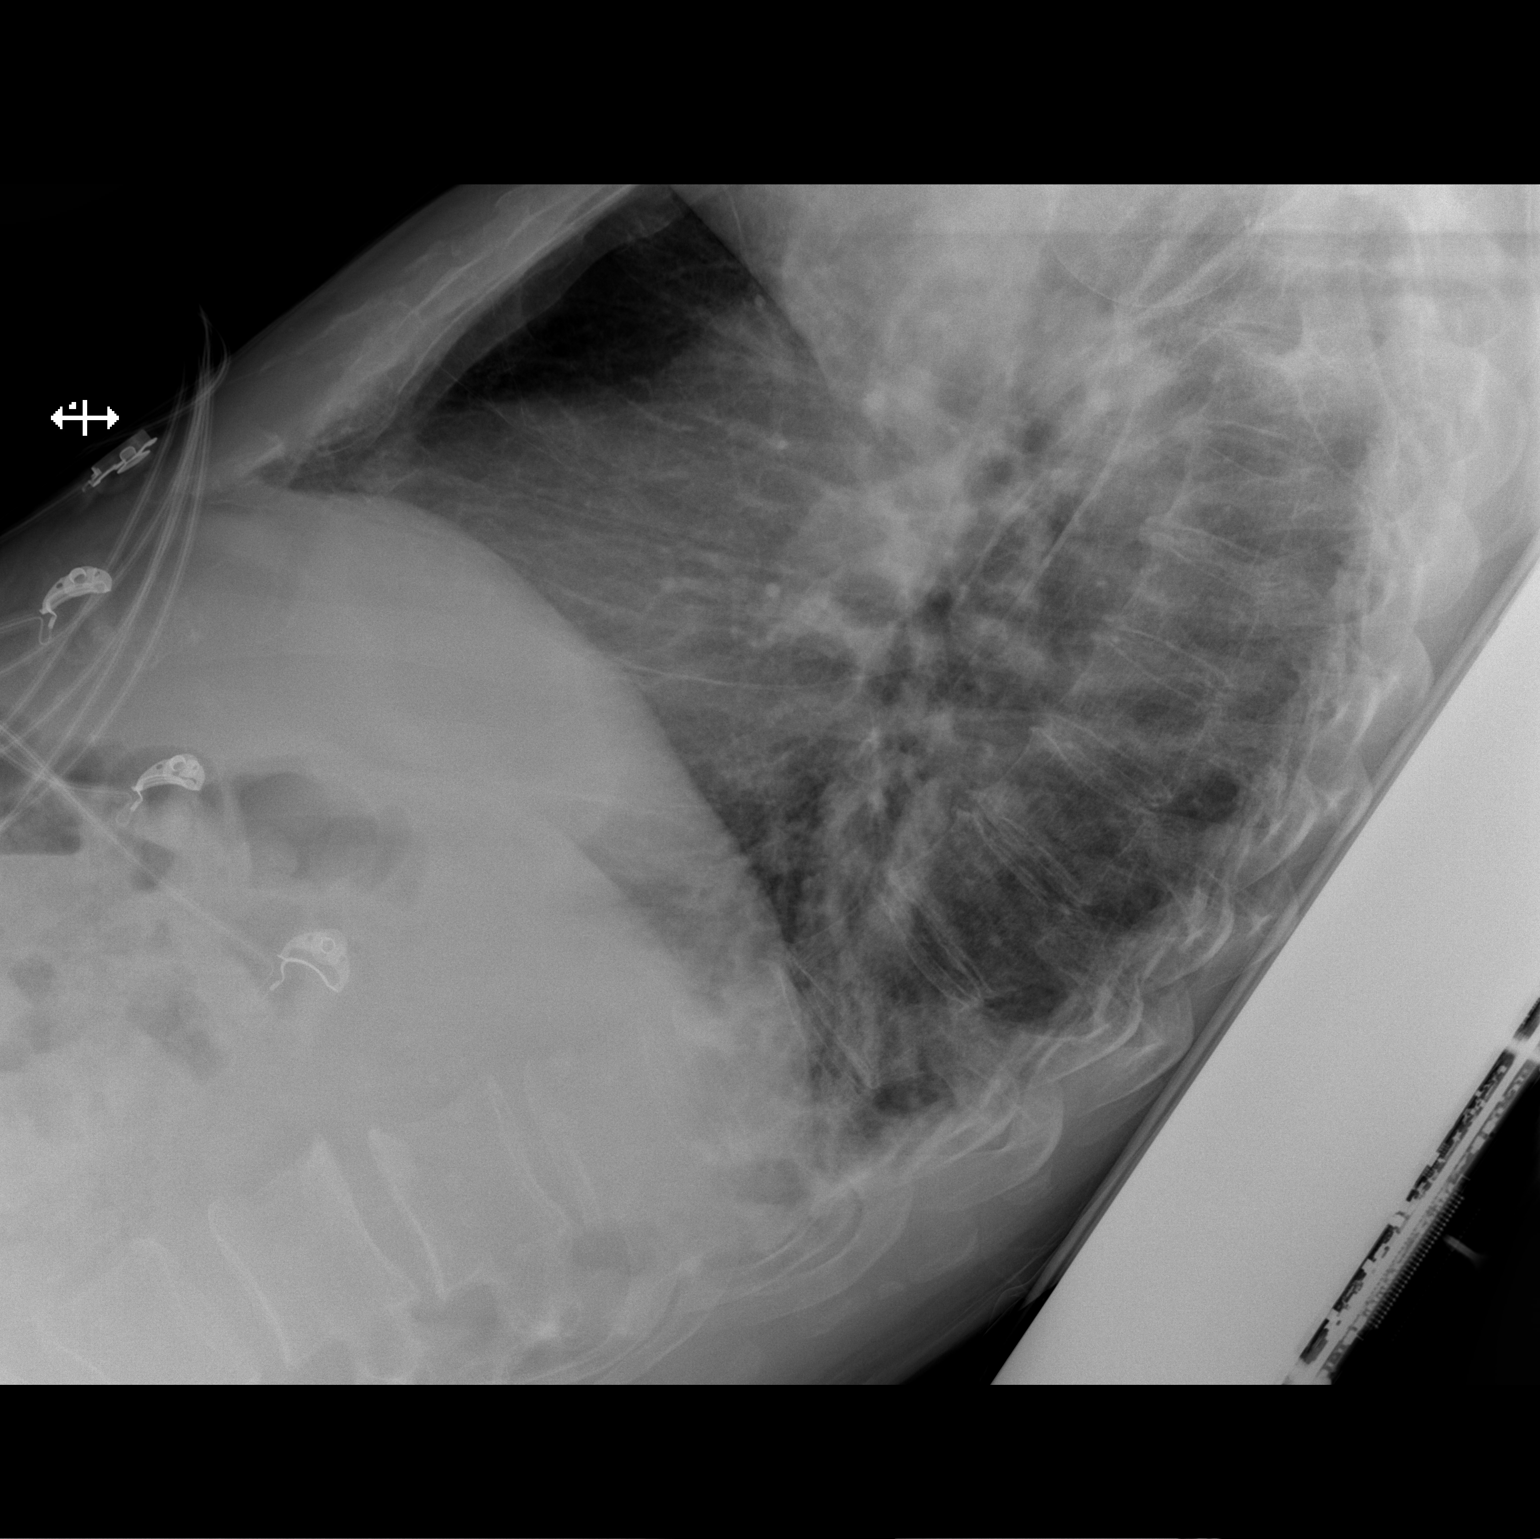

[2 of 2 positions shown; findings below may reference images not displayed]

FINDINGS: There is mild cardiomegaly. There is bibasilar atelectasis but no
focal consolidation. No pulmonary edema. No pleural effusion or
pneumothorax.
IMPRESSION: No active cardiopulmonary disease.

## 2024-07-14 ENCOUNTER — Encounter (HOSPITAL_BASED_OUTPATIENT_CLINIC_OR_DEPARTMENT_OTHER): Attending: Internal Medicine | Admitting: Internal Medicine

## 2024-07-14 DIAGNOSIS — I87311 Chronic venous hypertension (idiopathic) with ulcer of right lower extremity: Secondary | ICD-10-CM

## 2024-07-14 DIAGNOSIS — B353 Tinea pedis: Secondary | ICD-10-CM | POA: Diagnosis not present

## 2024-07-14 DIAGNOSIS — L97812 Non-pressure chronic ulcer of other part of right lower leg with fat layer exposed: Secondary | ICD-10-CM

## 2024-07-19 ENCOUNTER — Encounter (HOSPITAL_BASED_OUTPATIENT_CLINIC_OR_DEPARTMENT_OTHER): Admitting: Internal Medicine
